# Patient Record
Sex: Male | Born: 1975 | Race: White | Hispanic: No | Marital: Married
Health system: Southern US, Community
[De-identification: ages and names within clinical notes are randomized; demographics above are authoritative.]

---

## 1999-08-13 ENCOUNTER — Encounter: Admission: RE | Admit: 1999-08-13 | Discharge: 1999-08-13 | Payer: Self-pay | Admitting: Gastroenterology

## 1999-08-13 ENCOUNTER — Encounter: Payer: Self-pay | Admitting: Gastroenterology

## 2003-07-22 ENCOUNTER — Inpatient Hospital Stay (HOSPITAL_COMMUNITY): Admission: EM | Admit: 2003-07-22 | Discharge: 2003-08-01 | Payer: Self-pay | Admitting: Emergency Medicine

## 2003-07-22 ENCOUNTER — Encounter: Admission: RE | Admit: 2003-07-22 | Discharge: 2003-07-22 | Payer: Self-pay | Admitting: Internal Medicine

## 2003-07-26 ENCOUNTER — Encounter (INDEPENDENT_AMBULATORY_CARE_PROVIDER_SITE_OTHER): Payer: Self-pay | Admitting: *Deleted

## 2003-07-26 ENCOUNTER — Encounter (INDEPENDENT_AMBULATORY_CARE_PROVIDER_SITE_OTHER): Payer: Self-pay | Admitting: Specialist

## 2003-08-07 ENCOUNTER — Encounter: Admission: RE | Admit: 2003-08-07 | Discharge: 2003-08-07 | Payer: Self-pay | Admitting: Thoracic Surgery

## 2003-08-28 ENCOUNTER — Encounter: Admission: RE | Admit: 2003-08-28 | Discharge: 2003-08-28 | Payer: Self-pay | Admitting: Thoracic Surgery

## 2003-10-30 ENCOUNTER — Encounter: Admission: RE | Admit: 2003-10-30 | Discharge: 2003-10-30 | Payer: Self-pay | Admitting: Thoracic Surgery

## 2005-07-27 ENCOUNTER — Encounter: Admission: RE | Admit: 2005-07-27 | Discharge: 2005-07-27 | Payer: Self-pay | Admitting: Family Medicine

## 2005-08-23 IMAGING — CR DG CHEST 2V
2 series · 2 of 2 positions shown · non-contrast
Comparison: none

CLINICAL DATA: Shortness of breath.
 CHEST, TWO VIEWS
 PA and lateral views reveal the heart size to be normal.  There is a large right pleural effusion with compression atelectasis and pneumonia of the right base.  There is also a small left effusion.  
 IMPRESSION
 Large right effusion with atelectasis and pneumonia in the right base.  There is also noted to be ileus of the colon.

[view not recorded (1 of 2)]
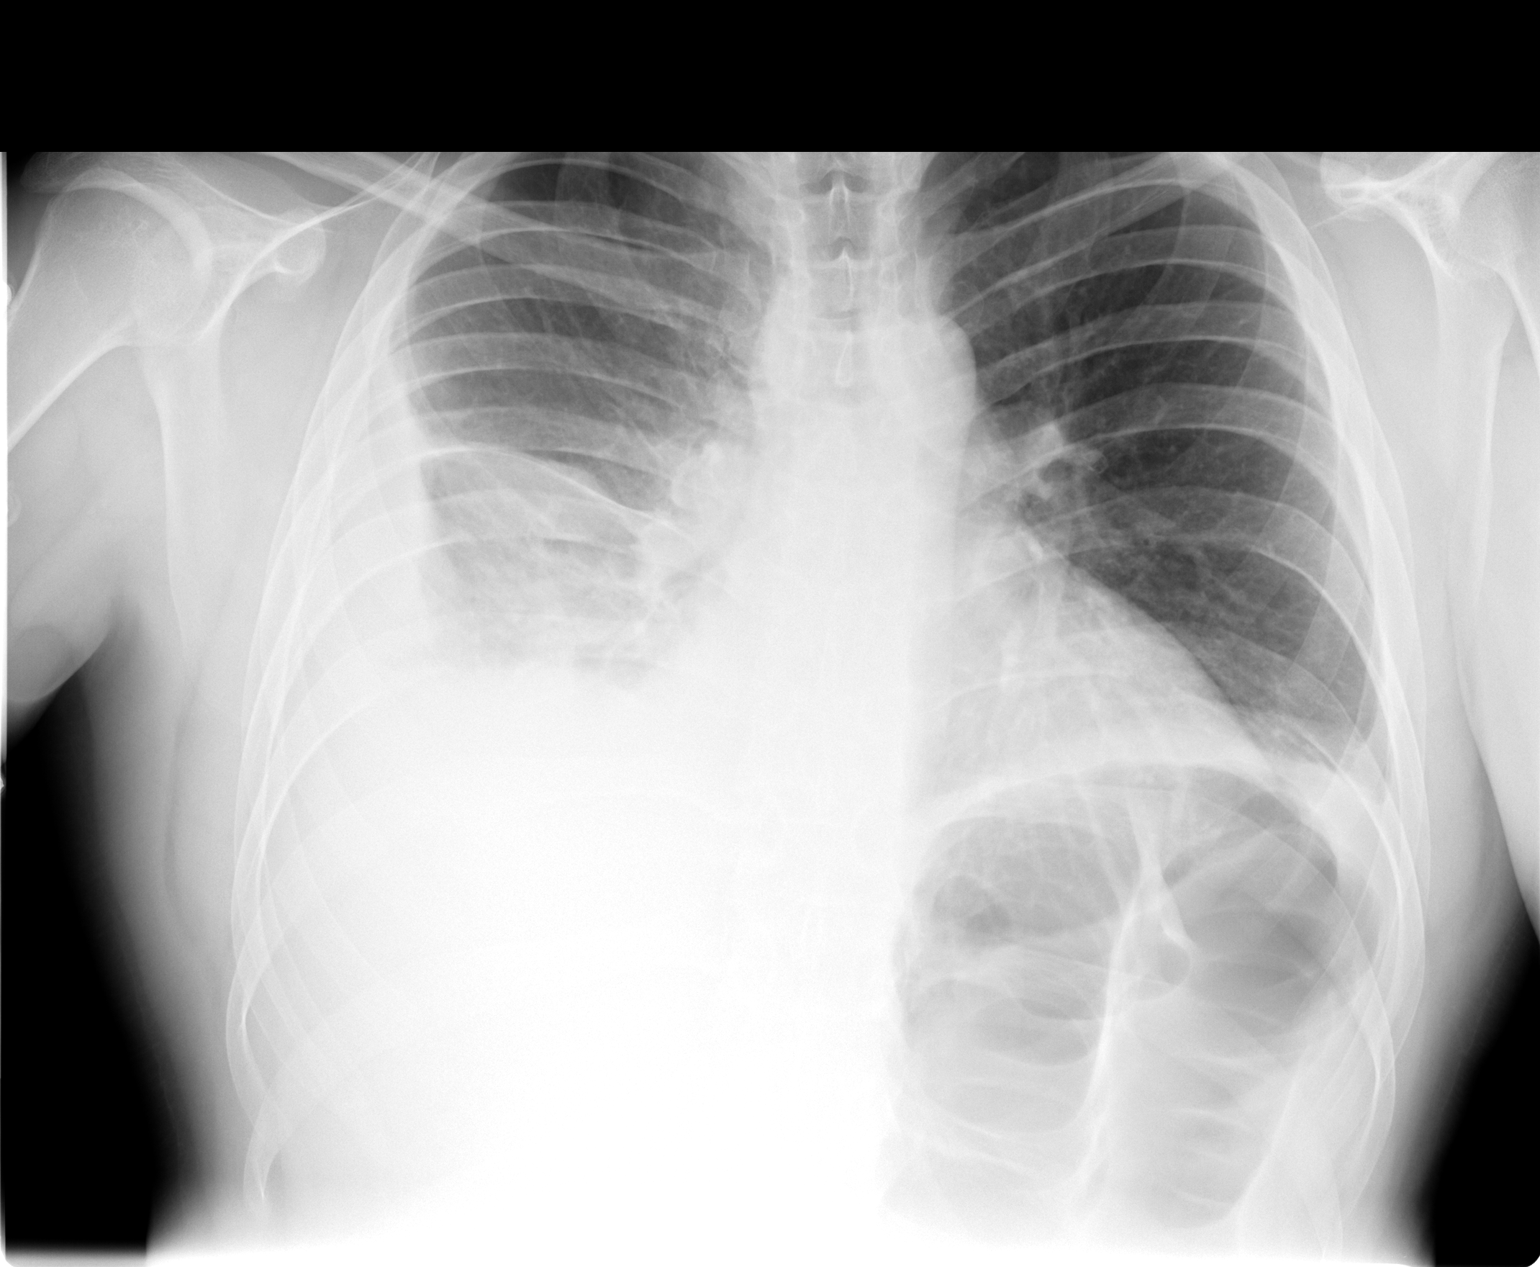

[view not recorded (2 of 2)]
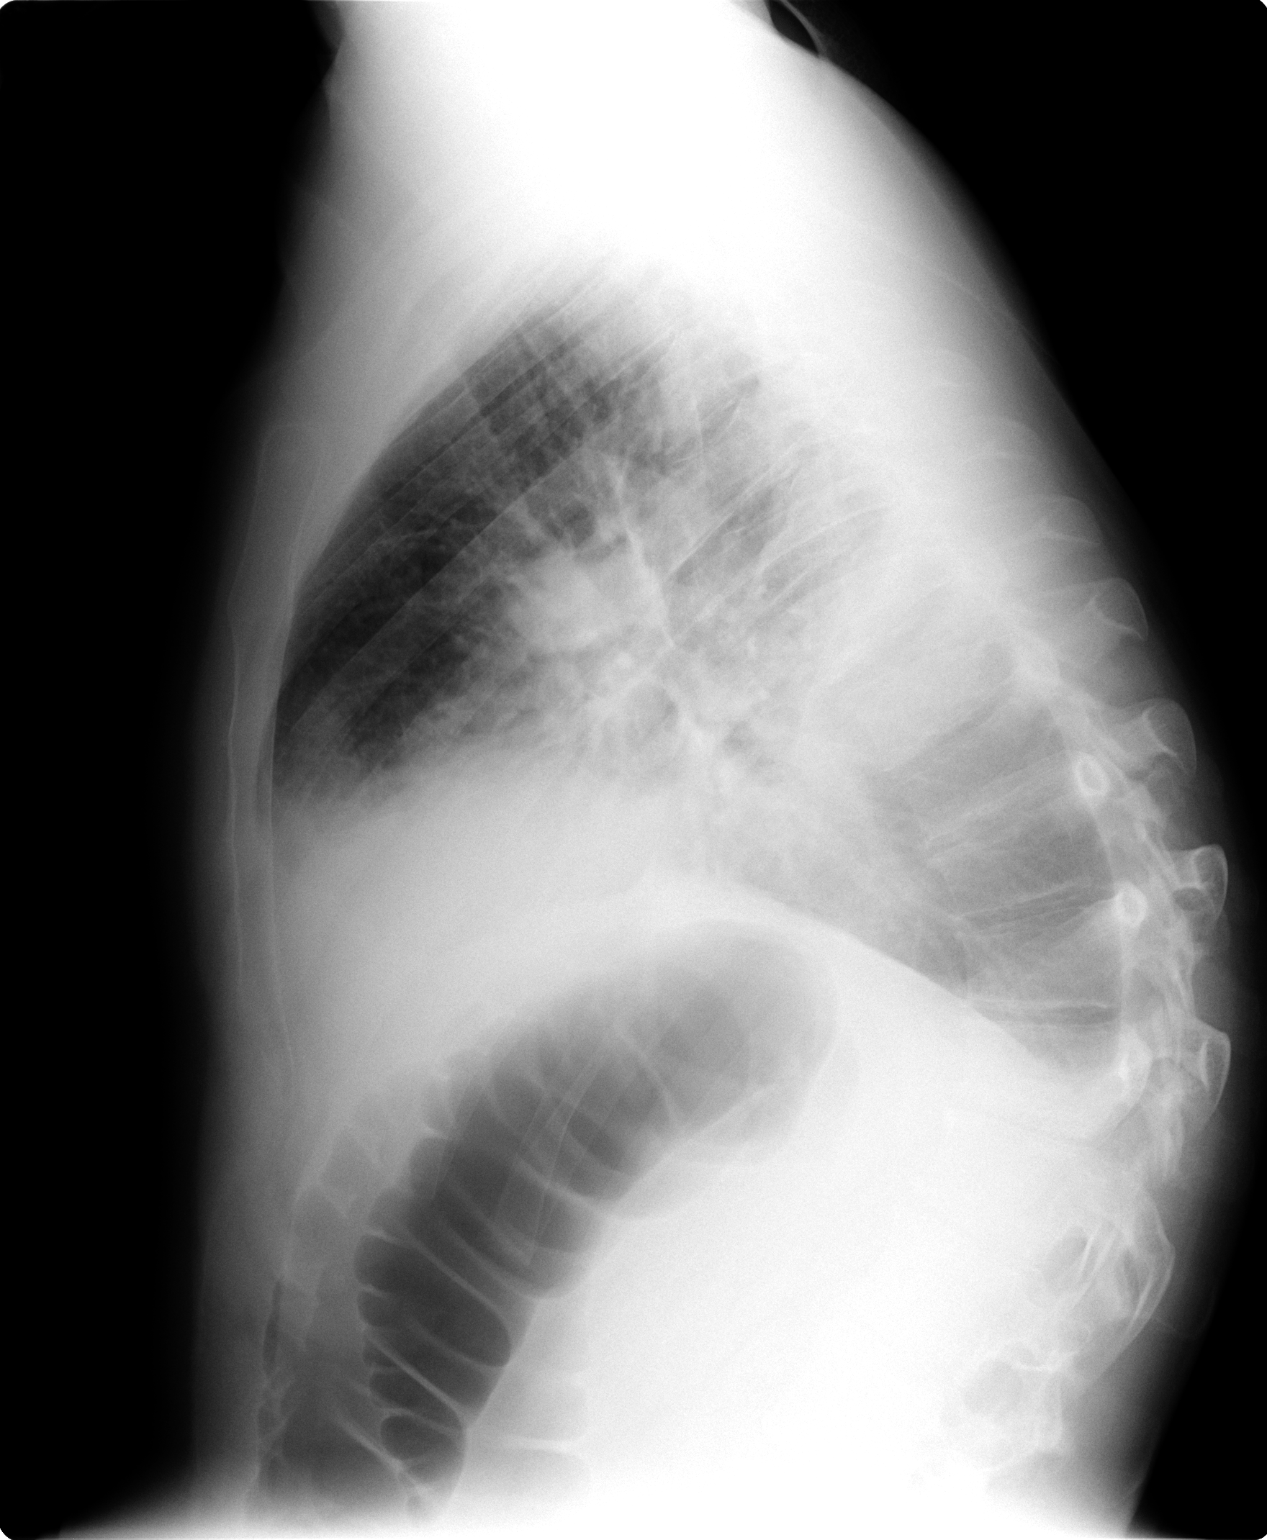

[2 of 2 positions shown; findings below may reference images not displayed]

## 2005-08-24 IMAGING — CR DG CHEST 1V
1 series · 1 of 1 positions shown · non-contrast
Comparison: none

CLINICAL DATA: Status post thoracentesis. 
 CHEST (ONE VIEW)
 Exam 3044 hours. Compared to 07/22/2003. 
 Slight decreased right pleural effusion.  No pneumothorax.  Cardiomegaly with low lung volumes, bibasilar atelectasis and moderate right pleural effusion persists. 
 IMPRESSION
 No pneumothorax following right thoracentesis.  A moderate amount of residual pleural fluid is identified in the right hemithorax.

[view not recorded]
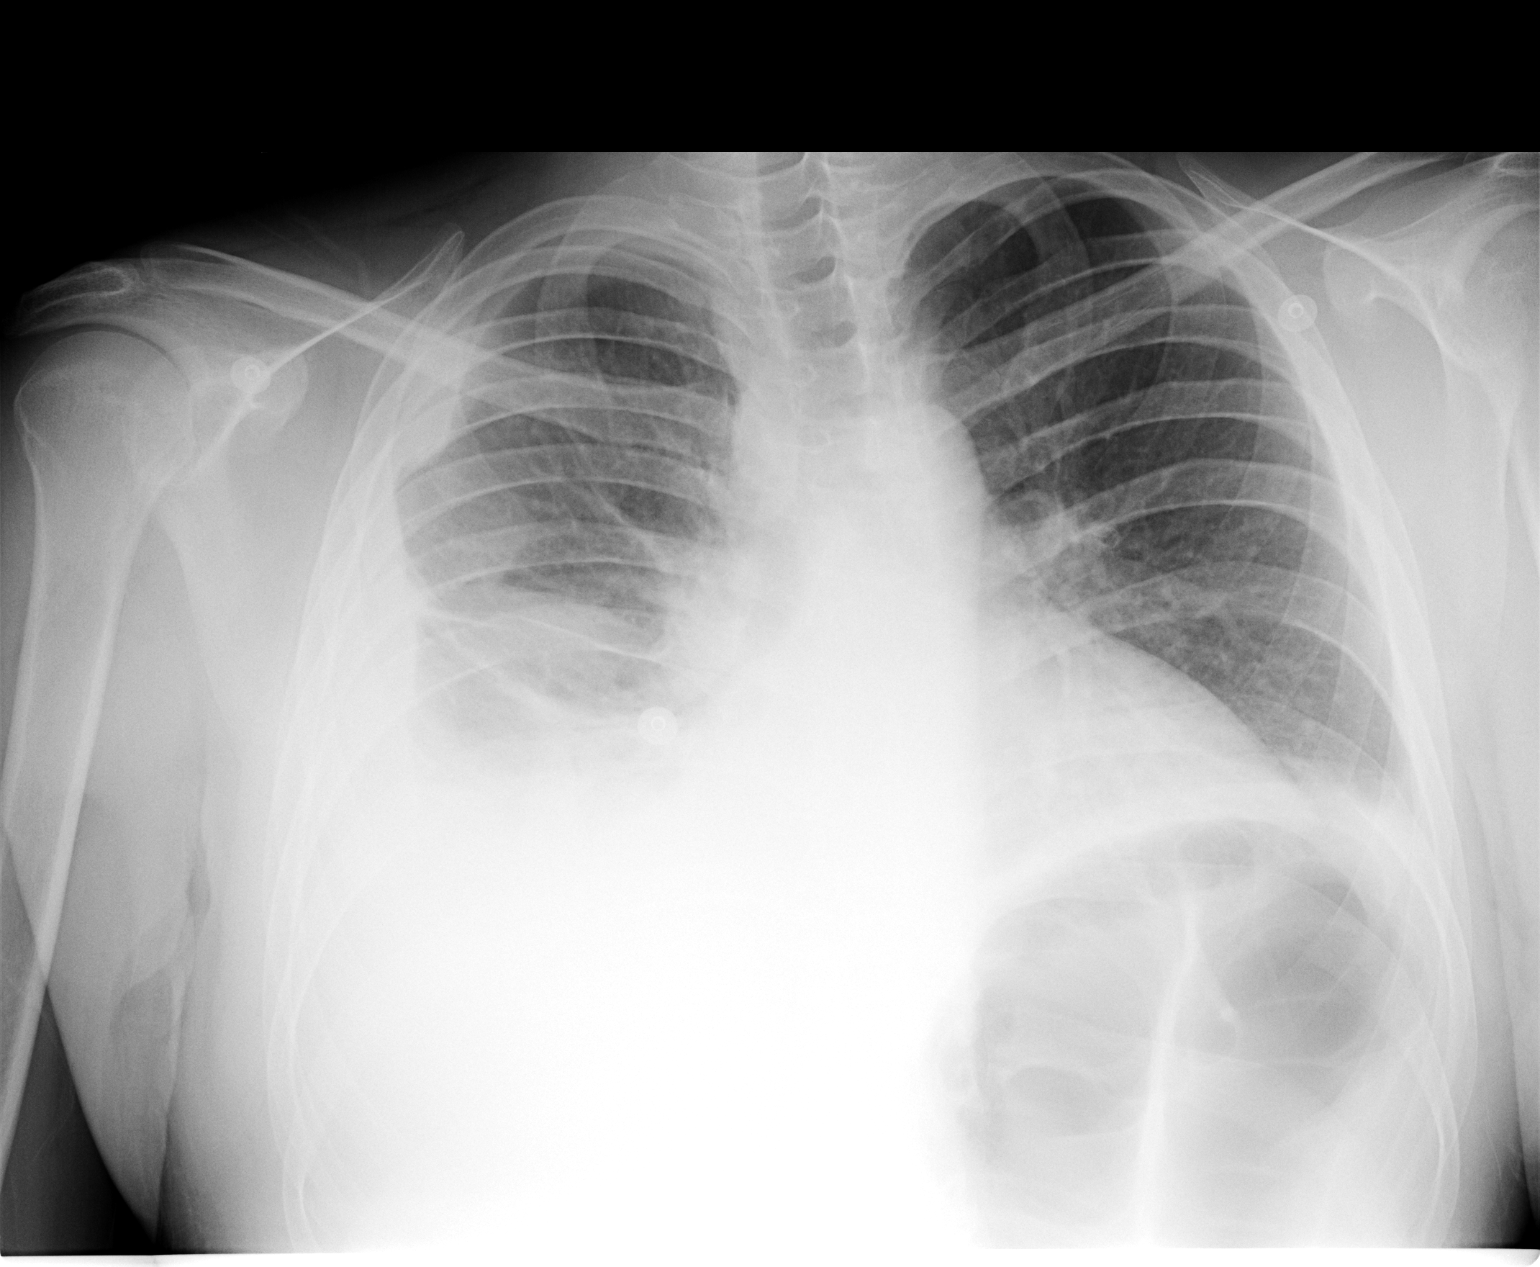

[1 of 1 positions shown; findings below may reference images not displayed]

## 2005-09-08 IMAGING — CR DG CHEST 2V
2 series · 2 of 2 positions shown · non-contrast
Comparison: 08/01/03.

CLINICAL DATA: Status-post right VATS for empyema drainage. 
 TWO VIEW CHEST

[view not recorded (1 of 2)]
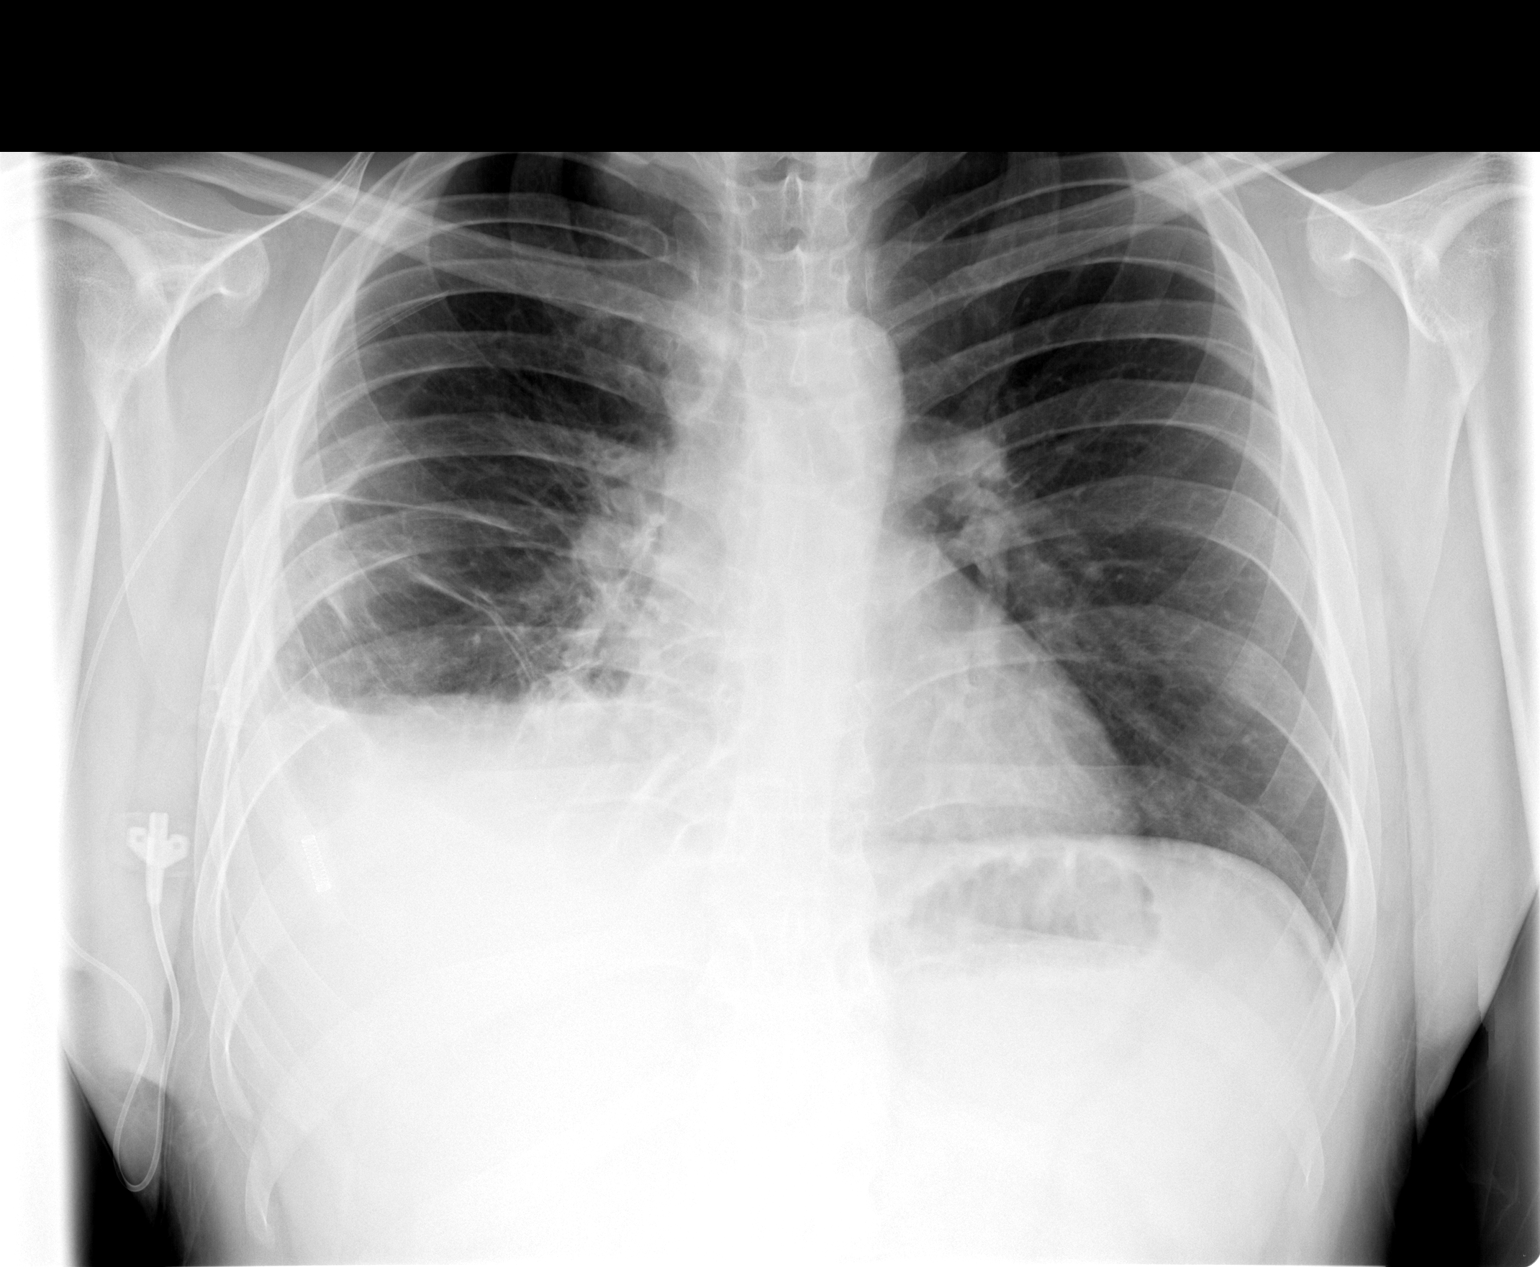

[view not recorded (2 of 2)]
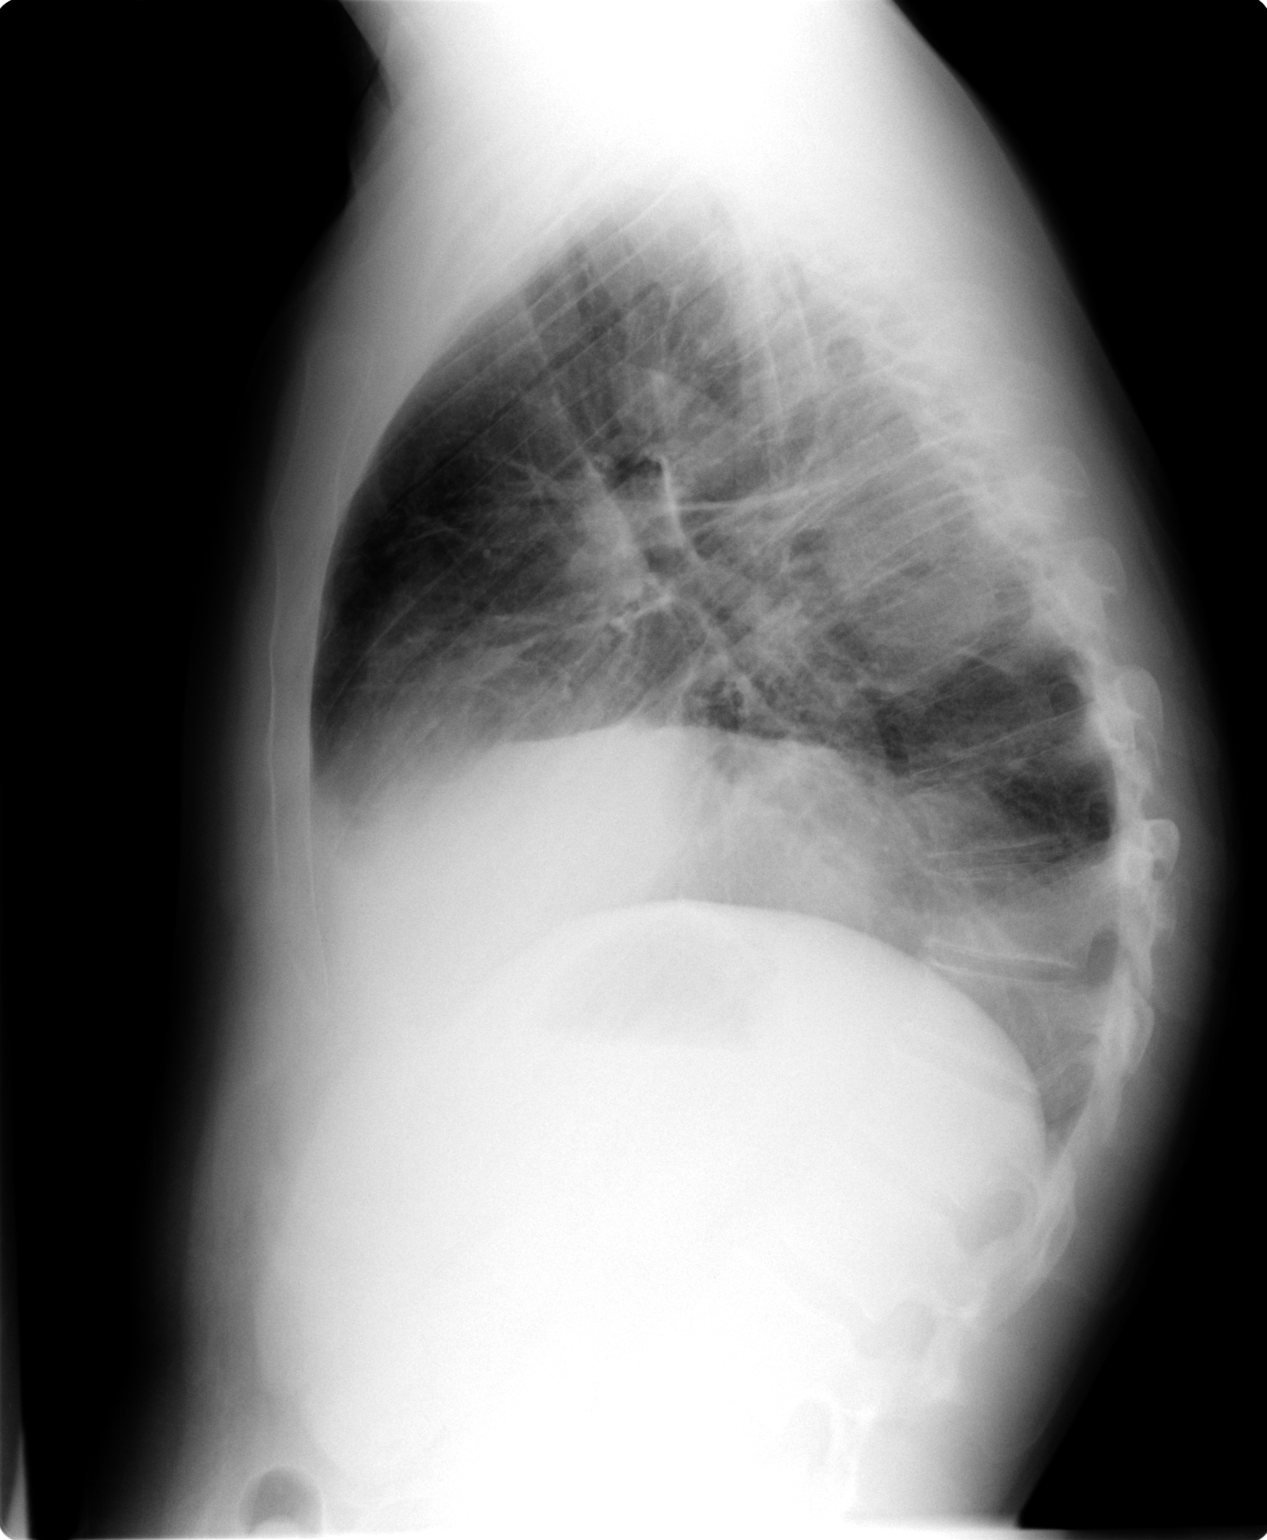

[2 of 2 positions shown; findings below may reference images not displayed]

FINDINGS: Two view exam of the chest shows persistent asymmetric elevation of the right hemidiaphragm.  There is right base chronic atelectasis or scar and right pleural fluid versus thickening.  Pleural opacity in the medial right apex is stable.  The right IJ central line seen on the previous exam has been removed and the right-sided PICC line has been pulled back with the tip now projecting at the level of the subclavian vein. 
 IMPRESSION
 No substantial interval change in the right base chronic atelectasis or scar with persistent small right pleural effusion versus pleural thickening. 
 Asymmetric elevation of the right hemidiaphragm.

## 2006-04-25 ENCOUNTER — Ambulatory Visit: Payer: Self-pay | Admitting: Internal Medicine

## 2006-04-27 ENCOUNTER — Ambulatory Visit: Payer: Self-pay | Admitting: Internal Medicine

## 2006-11-01 ENCOUNTER — Ambulatory Visit: Payer: Self-pay | Admitting: Family Medicine

## 2007-05-18 ENCOUNTER — Ambulatory Visit: Payer: Self-pay | Admitting: Family Medicine

## 2010-09-11 NOTE — Op Note (Signed)
NAME:  [name], [name]                          ACCOUNT NO.:  [id number]   MEDICAL RECORD NO.:  [id number]                   PATIENT TYPE:  INP   LOCATION:  3308                                 FACILITY:  [facility]   PHYSICIAN:  Ines Bloomer, M.D.              DATE OF BIRTH:  [birth year]   DATE OF PROCEDURE:  07/26/2003  DATE OF DISCHARGE:                                 OPERATIVE REPORT   PREOPERATIVE DIAGNOSIS:  Right lower lobe pneumonia with peripneumonic  infusion, probable empyema.   POSTOPERATIVE DIAGNOSIS:  Right chest empyema.   OPERATION PERFORMED:  Right VATS, right thoracotomy with drainage of empyema  and decortication.   SURGEON:  Ines Bloomer, M.D.   FIRST ASSISTANT:  Carolyn A. Thelma Barge, P.A.-C.   ANESTHESIA:  General.   After percutaneous insertion of all monitoring lines, the patient was turned  to the right lateral thoracotomy position.  A dual lumen tube was inserted.  The patient was prepped and draped in the usual sterile manner.  Two trocar  sites were made in the anterior and  posterior axillary line at the seventh  to eighth intercostal space.  Two trocars were inserted and the 30 degree  scope was inserted.  The patient obviously had empyema with a marked amount  of exudate and fluid.  The right lower lobe, right middle lobe, and part of  the right upper lobe were all covered.  Because an extensive partial  debridement was done using Kaiser ring forceps removing the fluid and the  exudate from the lateral wall, however, there was so much exudate we decided  to do a mini-thoracotomy.  An incision was made over the sixth intercostal  space, the latissimus was partially divided, and the serratus was reflected  anteriorly.  The sixth intercostal space was entered.  Two Tuffier  retractors were placed at right angles.  The lung was first freed up freeing  the right middle lobe and right lower lobe off the diaphragm and then  freeing the right middle lobe  and right upper lobe anterior segment off the  mediastinum and the right upper lobe off the apex and then laterally freeing  the right upper lobe and right middle lobe off the lateral chest wall.  After the lung had been freed up, the parietal pleura was debrided of all  exudate and the diaphragm was debrided of all exudate, particularly in the  right costophrenic angle.  After all debris had been removed from the  parietal pleura, attention was turned to the right lower lobe.  This was  grasped with a Duval lung clamp and then using ring forceps as well as  Russian forceps, the exudate was debrided off the right middle lobe.  There  were two areas in the fissure, both the major fissure and the minor fissure,  which had marked collection of pus and this was debrided out and debrided  off the right middle lobe and right lower lobe and then off the right middle  lobe and the right upper lobe.  Attention was turned to the right middle  lobe after the right lower lobe had been decorticated and in a like manner,  this was done as well as in the right upper lobe.  After all debris had been  removed from the visceral pleura, two chest tubes were inserted through the  trocar sites, number straight 36 and tied in place with 0 silk.  The area  was copiously irrigated.  A third chest tube was placed between the other  two chest tubes which was a right angle and sutured in place with 0 silk.  A  Marcaine block was done in the usual fashion and the chest was closed with  three pericostals.  The catheters were placed in the interspace in the usual  fashion.  The muscle was closed with #1 Vicryl, the subcutaneous tissue with  2-0 Vicryl, and Dermabond for the skin.  The patient was returned to the  recovery room in serious condition.                                               Ines Bloomer, M.D.    DPB/MEDQ  D:  07/26/2003  T:  07/26/2003  Job:  [id number]

## 2010-09-11 NOTE — H&P (Signed)
NAME:  [name], [name]                          ACCOUNT NO.:  [id number]   MEDICAL RECORD NO.:  [id number]                   PATIENT TYPE:  EMS   LOCATION:  MAJO                                 FACILITY:  [facility]   PHYSICIAN:  Deirdre Peer. Polite, M.D.              DATE OF BIRTH:  July 21, 1975   DATE OF ADMISSION:  07/22/2003  DATE OF DISCHARGE:                                HISTORY & PHYSICAL   PRIMARY CARE PHYSICIAN:  Sharlet Salina, M.D. of the [facility].   CHIEF COMPLAINT:  Shortness of breath, chest pain.   HISTORY OF PRESENT ILLNESS:  Mr. Hjort is a 35 year old male with past  medical history of Crohn's diagnosed in 1995 which has been quiescent since  then who presents to the ED after being evaluated in his primary M.D.'s  office for the other above chief complaint.  The patient dates his symptoms  back to 2 weeks ago when he had fever, chills, and myalgias.  These symptoms  lasted for approximately 3 to 4 days.  The patient was exposed to a sick  contact, his daughter; however, he stated that those symptoms got better.  Approximately one week ago, the patient complained of right-sided pain and  at that time was seen at an urgent care, last Thursday.  At that time,he was  diagnosed with pleurisy and treated with steroids and pain medication.  The  patient's symptoms progressively got worse; therefore, presented to his  primary M.D. for evaluation.  At that time, the patient had a chest x-ray  which showed significant pleural effusion.  The patient was sent to the ED  for further evaluation.  In the ED, the patient had CT of the chest which  showed significant right-sided effusion, questionable small loculated areas.  Screening labs show significant leukocytosis.  [facility] were  called for further evaluation because of the above complaint.  As stated  above, the patient stated the symptoms started about 2 weeks ago with fever,  chills, and myalgias and  nonproductive cough that subsequently proceeded to  a right-sided pain and shortness of breath.  In addition, the patient  experienced some weight loss secondary to decreased appetite.  He denied any  night sweats.  As stated his cough was nonproductive.  He also denies any  adenopathy.  The patient denies ever having any similar symptoms before.  Admission was deemed necessary for further evaluation and treatment of right-  sided effusion.   PAST MEDICAL HISTORY:  Significant for Crohn's disease diagnosed in 1996.  The patient is without any medications greater than 4 years for the Crohn's  disease.  He denies any other medical problems.   MEDICATIONS:  None.   SOCIAL HISTORY:  Negative for tobacco.  Negative for alcohol.  No drugs.   PAST SURGICAL HISTORY:  Significant for a history of perforated viscus in  1996, questionable partial colectomy.  At  that time colostomy x3 months.  He  also had an appendectomy at that time.  The patient denies cholecystectomy.   ALLERGIES:  No known drug allergies.Marland Kitchen   FAMILY HISTORY:  Noncontributory.   PHYSICAL EXAMINATION:  VITAL SIGNS:  Temperature 98, BP 117/87, pulse 105,  respiratory rate 20.  Saturation 95% on 2 liters.  GENERAL:  The patient is ill in appearance with obvious shortness of breath.  HEENT:  Significant for icteric sclerae.  No oral lesions.  No nodes.  CHEST:  Decreased breath sounds one-half up on the right, decreased in the  base on the left, positive dullness to percussion.  CARDIOVASCULAR:  Tachycardia.  ABDOMEN:  Soft, right upper quadrant tenderness with palpation.  No  hepatomegaly appreciated.  EXTREMITIES:  No edema.  NEUROLOGICAL:  Nonfocal.   DATA:  White count 26.5, hemoglobin 15.3, platelets 392.  Sodium 134,  potassium 3.4, chloride 92, carbon dioxide 28, BUN 9, creatinine 0.8, AST  and ALT 30 and 49 respectively, bilirubin 6.2.   ASSESSMENT AND PLAN:  1. Right-sided pleural effusion rule out infections  etiology, i.e.,     parapneumonic effusion versus other cause.  2. Dyspnea on exertion secondary to #1.  3. Significant leukocytosis.   Recommend the patient be admitted to step-down unit for evaluation and  treatment of significant right pleural effusion causing respiratory  difficulty.  The patient will be started with broad-spectrum antibiotics, IV  Zosyn, will be given frequent nebs, O2, and will arrange plans for an urgent  thoracentesis.  As there is very minimal loculation, we will start with the  thoracentesis and the patient may require a CT surgical consult.  Will  obtain cultures of the fluid to rule any pathology as well as panculture.  Will make further recommendations after review of the above studies.                                                Deirdre Peer. Polite, M.D.    RDP/MEDQ  D:  07/22/2003  T:  07/22/2003  Job:  [id number]   cc:   Sharlet Salina, M.D.  [address]  [place]  [place] [address]  Fax: [phone]

## 2010-09-11 NOTE — Discharge Summary (Signed)
NAME:  [name], [name]                          ACCOUNT NO.:  [id number]   MEDICAL RECORD NO.:  [id number]                   PATIENT TYPE:  INP   LOCATION:  5704                                 FACILITY:  [facility]   PHYSICIAN:  Ines Bloomer, M.D.              DATE OF BIRTH:  1975/09/27   DATE OF ADMISSION:  07/22/2003  DATE OF DISCHARGE:  07/31/2003                                 DISCHARGE SUMMARY   ADMISSION DIAGNOSIS:  Shortness of breath and chest pain.   PAST MEDICAL HISTORY:  Crohn's disease diagnosed in 15.   ALLERGIES:  No known drug allergies.   DISCHARGE DIAGNOSES:  Group A Streptococcal pneumonia, empyema, and  bacteremia, status post right video-assisted thoracic surgery, right  thoracotomy with drainage of empyema and decortication.   HISTORY OF PRESENT ILLNESS:  The patient is a 35 year old male with a past  medical history significant for Crohn's disease diagnosed in 1995 from which  he has had no problems since that time.  The patient presented to the  emergency department on July 22, 2003, after being evaluated by his primary  care doctor for shortness of breath and chest pain.  The patient stated his  symptoms began approximately two weeks prior to admission when he also had  fever, chills, and myalgias.  His symptoms lasted approximately three to  four days.  The patient was exposed to a sick contact, his daughter,  however, he stated that his symptoms improved.  Approximately one week prior  to admission, the patient complained of right-sided pain and was seen at  that time at Urgent Care.  The patient was diagnosed with pleurisy and  treated with steroids and pain medication.  The patient's symptoms  progressively worsened and therefore he presented to his primary care  physician for evaluation.  At that time, the patient had a chest x-ray which  showed a significant pleural effusion.  The patient was sent to the  emergency department for further  evaluation.  A CT of the chest was obtained  in the ED which showed significant right-sided effusion, with a questionable  small loculated areas.  Initial screening labs showed significant  leukocytosis.  [facility] were consulted at that time for further  evaluation.  After evaluation by Deirdre Peer. Polite, M.D., the patient was  subsequently admitted for further evaluation and treatment of right-sided  effusion.   HOSPITAL COURSE:  The patient presented to the emergency department on July 22, 2003, complaining of shortness of breath and chest pain.  The patient  was evaluated by the emergency department physician as well as a  Hospitalist, Dr. Nehemiah Settle.  After discovering a significant right-sided  effusion with questionable small loculated areas, the patient was admitted  for further evaluation and treatment of her right-sided effusion.  The  patient was started on IV Zosyn, given nebulizers, O2, and an urgent  thoracentesis was arranged.  Initial  labs showed hypokalemia with a  potassium of 2.6 which was subsequently treated.  Labs also revealed a  hyperbilirubinemia with a level of 6.2.  The Hospitalist subsequently  required a GI consult as well as a CT consult.  Dr. Edwyna Shell evaluated the  patient on July 22, 2003, and agreed with thoracentesis with fluid  cultures.  It was his opinion that the patient would likely need a video-  assisted thoracic surgery decortication.  He was also concerned about the  hyperbilirubinemia and suggested a GI consult prior to general anesthesia.  Interventional radiology performed an ultrasound guided right thoracentesis  on July 23, 2003, which yielded 720 mL of thick, yellow, purulent fluid.  This was sent for labs and cultures.  Chest x-ray status post thoracentesis  revealed no change in the effusion.  The patient had a loculated empyema and  would require a decortication.  Repeat labs revealed a bilirubin of 4.3  which had decreased.   Pleural fluid and blood cultures revealed gram  positive cocci.  GI consultation was obtained on July 24, 2003, regarding  the patient's hyperbilirubinemia.  An abdominal ultrasound revealed  gallbladder sludge and liver hemangioma.  Hepatitis A, B, and C were all  negative.  It was the GI doctor's opinion that hyperbilirubinemia was  probably secondary to the bacteremia.  It was his recommendation that an MR  cholangiogram be performed.  The patient was maintained on IV antibiotics.  Dr. Ewing Schlein reevaluated the patient on March 31, prior to surgery.  It was his  opinion that the patient did not have chronic liver disease.  His elevated  liver enzymes on admission were normalizing at that time.  It was his  opinion that the patient should proceed with surgery as planned.   The patient was taken to the OR on July 26, 2003, for a right video-assisted  thoracic surgery, mini thoracotomy, drainage of empyema, and decortication.  The patient tolerated the procedure well and was hemodynamically stable  immediately postoperatively.  Specimens and findings that were obtained  during the surgery included pleural fluid and empyema which were sent for  culture and gram stain.  The chest cavity was really infected with multiple  pleural abscesses.  The patient was transferred from the OR to the  postanesthesia care unit in stable condition.  The patient was extubated  without complications and awoken from anesthesia neurologically intact.  The  patient was subsequently transferred to unit 3300 without complications.  Pleural fluid cultures were positive for group A Streptococcus pyogenes and  therefore infectious disease was consulted regarding longterm antibiotic  recommendations.  The patient has progressed as expected postoperatively and  has remained in stable condition.  The patient's anterior chest tube was  discontinued on postoperative day #3 without complications.  Dr. Roxan Hockey of infectious  disease evaluated the patient on July 29, 2003.  It was his  recommendation that the patient be started on IV Rocephin.  The patient's  posterior chest tube was discontinued on postoperative day #4 without  complications.  On postoperative day #5, the patient was in stable condition  and was feeling well.  The last chest tube was discontinued without  complications and the patient was transferred to unit 5700 without problems.  The patient's white count had decreased to 13,000.  The patient was felt to  be in stable condition for discharge within the next one to two days on home  IV antibiotics.   LABORATORY DATA:  CBC on July 31, 2003;  white count 13.5, hemoglobin 9.9,  hematocrit 28.5, platelets 639.  Chem-7 on July 29, 2003; sodium 133,  potassium 3.9, chloride 99, CO2 28, BUN 3, creatinine 0.8, glucose 114.  AST  34, ALT 30, alkaline phosphatase 129, total bilirubin 1.8.   CONDITION ON DISCHARGE:  Improved.   DISCHARGE MEDICATIONS:  1. Rocephin 2 grams IV q.24h x2 weeks from the date of surgery, last dose to     be administered on August 09, 2003.  2. Tylox one to two p.o. q.4-6h p.r.n. pain.   ACTIVITY:  No driving, no strenuous activity, and the patient is to continue  daily breathing and walking exercises.   DIET:  No restrictions.   WOUND CARE:  The patient is to shower daily and clean the incisions with  soap and water.   DISCHARGE INSTRUCTIONS:  If the patient notices that his incisions are red,  swollen, or draining or if he has a fever of 101 degrees Fahrenheit, he is  to call the [facility].  A home health nurse will be established to  administer outpatient IV antibiotics.   FOLLOW UP:  Appointment with Dr. Edwyna Shell on August 07, 2003, at 4:30 p.m.  The  patient must go to [facility] one hour prior to this  appointment to have a chest x-ray taken which he will bring with him to the  appointment with Dr. Edwyna Shell.      Pecola Leisure, [place]                       Ines Bloomer, M.D.    AY/MEDQ  D:  07/31/2003  T:  08/02/2003  Job:  [id number]

## 2014-01-11 DIAGNOSIS — M79609 Pain in unspecified limb: Secondary | ICD-10-CM

## 2015-10-16 DIAGNOSIS — R52 Pain, unspecified: Secondary | ICD-10-CM

## 2016-05-28 ENCOUNTER — Ambulatory Visit (INDEPENDENT_AMBULATORY_CARE_PROVIDER_SITE_OTHER): Payer: 59 | Admitting: Podiatry

## 2016-05-28 ENCOUNTER — Ambulatory Visit (INDEPENDENT_AMBULATORY_CARE_PROVIDER_SITE_OTHER): Payer: 59

## 2016-05-28 ENCOUNTER — Encounter: Payer: Self-pay | Admitting: Podiatry

## 2016-05-28 VITALS — HR 69 | Resp 16 | Ht 70.0 in | Wt 168.0 lb

## 2016-05-28 DIAGNOSIS — M722 Plantar fascial fibromatosis: Secondary | ICD-10-CM

## 2016-05-28 MED ORDER — DICLOFENAC SODIUM 75 MG PO TBEC: 75.0000 mg | DELAYED_RELEASE_TABLET | Freq: Two times a day (BID) | ORAL | 2 refills | Status: DC

## 2016-05-28 MED ORDER — TRIAMCINOLONE ACETONIDE 10 MG/ML IJ SUSP
10.0000 mg | Freq: Once | INTRAMUSCULAR | Status: AC
  Administered 2016-05-28: 10 mg

## 2016-05-28 NOTE — Progress Notes (Signed)
   Subjective:    Patient ID: [name], male    DOB: 03/22/1976, 41 y.o.   MRN: [id number]  HPI  Chief Complaint  Patient presents with  . Foot Pain    Right; Bottom of heel pain x 8 months. 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        Review of Systems     Objective:   Physical Exam        Assessment & Plan:

## 2016-05-28 NOTE — Patient Instructions (Signed)

## 2016-05-28 NOTE — Progress Notes (Signed)
Subjective:     Patient ID: [name], male   DOB: 04/26/1975, 41 y.o.   MRN: [id number]  HPI patient presents with exquisite discomfort in the right heel at the insertional point of the tendon into the calcaneus with inflammation fluid and states that he is a runner and put a lot of pressure on his feet. States it's been present for about 8 months   Review of Systems  All other systems reviewed and are negative.      Objective:   Physical Exam  Constitutional: He is oriented to person, place, and time.  Cardiovascular: Intact distal pulses.   Musculoskeletal: Normal range of motion.  Neurological: He is oriented to person, place, and time.  Skin: Skin is warm.  Nursing note and vitals reviewed.  neurovascular status found to be intact muscle strength was adequate range of motion within normal limits with no equinus condition noted with moderate depression of the arch and exquisite discomfort plantar fascia right at the insertional point of the tendon into the calcaneus     Assessment:     Acute plantar fasciitis with chronic element to condition    Plan:     H&P x-rays reviewed and today I injected the right plantar fascia 3 mg Kenalog 5 mg Xylocaine dispensed fascial brace with instructions on usage gave instructions on physical therapy and placed on diclofenac 75 mg twice a day. Discussed long-term orthotics and reappoint in 2 weeks to reevaluate  X-ray indicates spur formation with no indications of stress fracture arthritis

## 2016-06-11 ENCOUNTER — Ambulatory Visit (INDEPENDENT_AMBULATORY_CARE_PROVIDER_SITE_OTHER): Payer: 59 | Admitting: Podiatry

## 2016-06-11 ENCOUNTER — Encounter: Payer: Self-pay | Admitting: Podiatry

## 2016-06-11 DIAGNOSIS — M722 Plantar fascial fibromatosis: Secondary | ICD-10-CM

## 2016-06-12 NOTE — Progress Notes (Signed)
Subjective:     Patient ID: [name], male   DOB: 11/09/1975, 41 y.o.   MRN: [id number]  HPI patient presents stating that his pain has improved but is still present right with discomfort and he wants to be very active and right   Review of Systems     Objective:   Physical Exam Neurovascular status intact with patient found to have inflammation and pain in the right plantar heel of a mild nature with significant improvement    Assessment:     Patient has biomechanical deformity with inflammation pain right heel that's improved but present    Plan:     Spent a great of time going over physical therapy supportive shoe gear usage and ice therapy and scanned for custom orthotics to reduce plantar pressure on the heel

## 2016-07-06 ENCOUNTER — Ambulatory Visit: Payer: 59 | Admitting: *Deleted

## 2016-07-06 DIAGNOSIS — M722 Plantar fascial fibromatosis: Secondary | ICD-10-CM

## 2016-07-06 NOTE — Patient Instructions (Signed)

## 2016-07-06 NOTE — Progress Notes (Signed)
Patient ID: [name], male   DOB: 11/11/1975, 41 y.o.   MRN: [id number]   Patient presents for orthotic pick up.  Verbal and written break in and wear instructions given.  Patient will follow up in 4 weeks if symptoms worsen or fail to improve.

## 2017-12-21 ENCOUNTER — Ambulatory Visit: Payer: 59 | Admitting: Podiatry

## 2017-12-21 ENCOUNTER — Other Ambulatory Visit: Payer: Self-pay | Admitting: Podiatry

## 2017-12-21 ENCOUNTER — Encounter: Payer: Self-pay | Admitting: Podiatry

## 2017-12-21 ENCOUNTER — Ambulatory Visit (INDEPENDENT_AMBULATORY_CARE_PROVIDER_SITE_OTHER): Payer: 59

## 2017-12-21 DIAGNOSIS — M79672 Pain in left foot: Secondary | ICD-10-CM

## 2017-12-21 DIAGNOSIS — M7662 Achilles tendinitis, left leg: Secondary | ICD-10-CM

## 2017-12-21 MED ORDER — DICLOFENAC SODIUM 75 MG PO TBEC: 75.0000 mg | DELAYED_RELEASE_TABLET | Freq: Two times a day (BID) | ORAL | 2 refills | Status: AC

## 2017-12-21 MED ORDER — TRIAMCINOLONE ACETONIDE 10 MG/ML IJ SUSP
10.0000 mg | Freq: Once | INTRAMUSCULAR | Status: AC
Start: 2017-12-21 — End: 2017-12-21
  Administered 2017-12-21: 10 mg

## 2017-12-21 NOTE — Patient Instructions (Signed)

## 2017-12-21 NOTE — Progress Notes (Signed)
Subjective:   Patient ID: [name], male   DOB: 42 y.o.   MRN: [id number]   HPI Patient states on the back of the left heel he noticed a knot on the side about a week ago and is been very sore and he is tried ice and oral anti-inflammatories without relief.  He is very active and likes to run 35 miles a day and is due to run a approximate 80 mile race in the beginning of the year   ROS      Objective:  Physical Exam  Neurovascular status intact with inflammation pain of the Achilles tendon medial side left with a central lateral looking good with fluid buildup around this insertional point     Assessment:  Acute Achilles tendinitis medial side left insertional point     Plan:  H&P condition reviewed and I discussed injection and explained risk of injection with rupture.  Patient wants to take this risk and I understand and explained to him that he will need to be less active for the next week and he promises me to be this way and carefully I injected the medial side 3 mg Kenalog 5 Milgram Xylocaine not putting it directly into the tendon I instructed on ice physical therapy heel lift and if symptoms persist  X-rays indicate there is no spur formation or indication of bony pathology

## 2018-05-22 ENCOUNTER — Other Ambulatory Visit: Payer: Self-pay | Admitting: Podiatry

## 2018-05-22 ENCOUNTER — Ambulatory Visit (INDEPENDENT_AMBULATORY_CARE_PROVIDER_SITE_OTHER): Payer: No Typology Code available for payment source

## 2018-05-22 ENCOUNTER — Encounter: Payer: Self-pay | Admitting: Podiatry

## 2018-05-22 ENCOUNTER — Ambulatory Visit: Payer: 59 | Admitting: Podiatry

## 2018-05-22 DIAGNOSIS — M7661 Achilles tendinitis, right leg: Secondary | ICD-10-CM | POA: Diagnosis not present

## 2018-05-22 DIAGNOSIS — M79671 Pain in right foot: Secondary | ICD-10-CM | POA: Diagnosis not present

## 2018-05-22 NOTE — Progress Notes (Signed)
Subjective:   Patient ID: [name], male   DOB: 43 y.o.   MRN: [id number]   HPI Patient states I have developed pain in my right Achilles after doing a half and then full marathon over consecutive days.  Not been able to run since and I felt like I had a knot and its been painful   ROS      Objective:  Physical Exam  Neurovascular status intact negative for plantar fasciitis with inflammation at the muscle compartment junction right with inflammation fluid occurring at this point but no indications of tendon dysfunction or tear     Assessment:  Probability for acute strain of the Achilles tendon at the muscle tendon junction right that is continuing to get aggravated and painful with activity     Plan:  H&P x-ray reviewed and today I went ahead and advised this patient on heat ice therapy and applied air fracture walker for complete immobilization and to not allow activation of the tendon.  I then discussed anti-inflammatories and gradual increase in activities over the next 4 to 6 weeks and reevaluate to recheck  X-ray was negative for signs of posterior spurring or any increase in density with plantar heel spur noted

## 2018-09-06 ENCOUNTER — Other Ambulatory Visit: Payer: Self-pay

## 2018-09-06 ENCOUNTER — Other Ambulatory Visit: Payer: Self-pay | Admitting: Podiatry

## 2018-09-06 ENCOUNTER — Ambulatory Visit (INDEPENDENT_AMBULATORY_CARE_PROVIDER_SITE_OTHER): Payer: No Typology Code available for payment source

## 2018-09-06 ENCOUNTER — Ambulatory Visit: Payer: No Typology Code available for payment source | Admitting: Podiatry

## 2018-09-06 ENCOUNTER — Encounter: Payer: Self-pay | Admitting: Podiatry

## 2018-09-06 VITALS — Temp 97.7°F

## 2018-09-06 DIAGNOSIS — M779 Enthesopathy, unspecified: Secondary | ICD-10-CM

## 2018-09-06 DIAGNOSIS — M79672 Pain in left foot: Secondary | ICD-10-CM

## 2018-09-06 MED ORDER — TRIAMCINOLONE ACETONIDE 10 MG/ML IJ SUSP
10.0000 mg | Freq: Once | INTRAMUSCULAR | Status: AC
  Administered 2018-09-06: 10 mg

## 2018-09-06 MED ORDER — DICLOFENAC SODIUM 75 MG PO TBEC: 75.0000 mg | DELAYED_RELEASE_TABLET | Freq: Two times a day (BID) | ORAL | 2 refills | Status: AC

## 2018-09-07 NOTE — Progress Notes (Signed)
Subjective:   Patient ID: [name], male   DOB: 43 y.o.   MRN: [id number]   HPI Patient presents stating I am getting a lot of pain in my forefoot and states that he ran a marathon in January and after his Achilles started to hurt and now this is started to hurt him over the last month   ROS      Objective:  Physical Exam  Neurovascular status intact with patient found to have exquisite inflammation pain around the second MPJ left foot with no digital dysfunction and significant diminishment of discomfort in the right fascia     Assessment:  Inflammatory capsulitis acute in nature left second MPJ from probable gait change with history of Achilles tendinitis     Plan:  H&P reviewed both condition and get a focus on the left foot today did sterile prep of the left foot and injected 60 mg like Marcaine mixture for anesthesia and went ahead and aspirated the second MPJ getting out a small amount of clear fluid and injected quarter cc Dexasone Kenalog after explaining risk of capsular injection.  Applied padding to reduce pressure on the joint surface advised on rigid bottom shoes refill diclofenac and reappoint to recheck again in 2 weeks  X-ray indicates that there is no signs of stress fracture or digital displacement associated with MPJ inflammation

## 2022-02-02 ENCOUNTER — Ambulatory Visit
Admission: EM | Admit: 2022-02-02 | Discharge: 2022-02-02 | Disposition: A | Discharge status: Home / Self Care | Payer: BC Managed Care – PPO | Attending: Physician Assistant | Admitting: Physician Assistant

## 2022-02-02 DIAGNOSIS — R21 Rash and other nonspecific skin eruption: Secondary | ICD-10-CM | POA: Diagnosis not present

## 2022-02-02 MED ORDER — METHYLPREDNISOLONE SODIUM SUCC 125 MG IJ SOLR
80.0000 mg | Freq: Once | INTRAMUSCULAR | Status: AC
  Administered 2022-02-02: 80 mg via INTRAMUSCULAR

## 2022-02-02 NOTE — ED Triage Notes (Signed)
[facility] presents to [facility] with co of bilateral eye pain, swelling and rash on arms and a few spots one on face, legs. Pt reports rash is itchy as well as eyes. Pt has taken benadryl and eye drops for symptoms

## 2022-02-02 NOTE — ED Provider Notes (Signed)
[facility]    CSN: [id number] Arrival date & time: 02/02/22  7253      History   Chief Complaint Chief Complaint  Patient presents with   Rash   Eye Pain    HPI [name] is a 46 y.o. male.   Patient here today for evaluation of rash that is been present to his arms and legs with a few spots on his face that started a few days ago.  He reports that he has had some bilateral eye pain and swelling as well.  He has taken Benadryl and eyedrops with some improvement of symptoms.  He reports his wife made him come in to be seen today.  He does not have shortness of breath.  He is unsure of exposure that might of caused rash but does state that the day before rash.  He had been outside on the soccer field most of the day.  The history is provided by the patient.  Rash Associated symptoms: no fever   Eye Pain    History reviewed. No pertinent past medical history.  There are no problems to display for this patient.   History reviewed. No pertinent surgical history.     Home Medications    Prior to Admission medications   Medication Sig Start Date End Date Taking? Authorizing Provider  diclofenac (VOLTAREN) 75 MG EC tablet Take 1 tablet (75 mg total) by mouth 2 (two) times daily. 12/21/17   Wallene Huh, DPM  diclofenac (VOLTAREN) 75 MG EC tablet Take 1 tablet (75 mg total) by mouth 2 (two) times daily. 09/06/18   Wallene Huh, DPM    Family History History reviewed. No pertinent family history.  Social History Social History   Tobacco Use   Smoking status: Unknown   Smokeless tobacco: Never     Allergies   Patient has no known allergies.   Review of Systems Review of Systems  Constitutional:  Negative for chills and fever.  Eyes:  Negative for discharge and redness.  Skin:  Positive for color change and rash. Negative for wound.     Physical Exam Triage Vital Signs ED Triage Vitals  Enc Vitals Group     BP 02/02/22 0830 (!)  134/95     Pulse Rate 02/02/22 0830 65     Resp 02/02/22 0830 18     Temp 02/02/22 0830 97.6 F (36.4 C)     Temp src --      SpO2 02/02/22 0830 98 %     Weight --      Height --      Head Circumference --      Peak Flow --      Pain Score 02/02/22 0829 3     Pain Loc --      Pain Edu? --      Excl. in [place]? --    No data found.  Updated Vital Signs BP (!) 134/95   Pulse 65   Temp 97.6 F (36.4 C)   Resp 18   SpO2 98%      Physical Exam Vitals and nursing note reviewed.  Constitutional:      General: He is not in acute distress.    Appearance: Normal appearance. He is not ill-appearing.  HENT:     Head: Normocephalic and atraumatic.  Eyes:     Conjunctiva/sclera: Conjunctivae normal.  Cardiovascular:     Rate and Rhythm: Normal rate.  Pulmonary:     Effort:  Pulmonary effort is normal.  Skin:    Comments: Mild erythema and swelling noted to right upper and lower eyelid, cluster of mildly erythematous papular lesions to left inner forearm  Neurological:     Mental Status: He is alert.  Psychiatric:        Mood and Affect: Mood normal.        Behavior: Behavior normal.        Thought Content: Thought content normal.      UC Treatments / Results  Labs (all labs ordered are listed, but only abnormal results are displayed) Labs Reviewed - No data to display  EKG   Radiology No results found.  Procedures Procedures (including critical care time)  Medications Ordered in UC Medications  methylPREDNISolone sodium succinate (SOLU-MEDROL) 125 mg/2 mL injection 80 mg (80 mg Intramuscular Given 02/02/22 0853)    Initial Impression / Assessment and Plan / UC Course  I have reviewed the triage vital signs and the nursing notes.  Pertinent labs & imaging results that were available during my care of the patient were reviewed by me and considered in my medical decision making (see chart for details).    Unknown etiology of symptoms but does appear consistent  with some form of contact dermatitis- similar to poison ivy.  Will treat with steroid injection in office.  Encouraged follow-up if no gradual improvement or with any further concerns.  Final Clinical Impressions(s) / UC Diagnoses   Final diagnoses:  Rash and nonspecific skin eruption   Discharge Instructions   None    ED Prescriptions   None    PDMP not reviewed this encounter.   Tomi Bamberger, PA-C 02/02/22 0900

## 2023-11-10 ENCOUNTER — Ambulatory Visit
Admission: EM | Admit: 2023-11-10 | Discharge: 2023-11-10 | Disposition: A | Discharge status: Home / Self Care | Attending: Nurse Practitioner | Admitting: Nurse Practitioner

## 2023-11-10 DIAGNOSIS — L03012 Cellulitis of left finger: Secondary | ICD-10-CM | POA: Diagnosis present

## 2023-11-10 MED ORDER — MUPIROCIN 2 % EX OINT: TOPICAL_OINTMENT | CUTANEOUS | 0 refills | Status: AC

## 2023-11-10 MED ORDER — SULFAMETHOXAZOLE-TRIMETHOPRIM 800-160 MG PO TABS: 1.0000 | ORAL_TABLET | Freq: Two times a day (BID) | ORAL | 0 refills | Status: AC

## 2023-11-10 NOTE — ED Provider Notes (Signed)
 [facility]    CSN: [id number] Arrival date & time: 11/10/23  1804      History   Chief Complaint Chief Complaint  Patient presents with   finger infection    HPI [name] is a 48 y.o. male.   Discussed the use of AI scribe software for clinical note transcription with the patient, who gave verbal consent to proceed.   The patient presents with an infection in their finger. The infection is located to the distal portion of the left middle finger. The affected area is swollen and painful. The patient admits to biting their nails, which likely contributed to the development of the infection. The patient denies any history of diabetes. He tried to drain the area himself but only got blood return. No fevers.   The following portions of the patient's history were reviewed and updated as appropriate: allergies, current medications, past family history, past medical history, past social history, past surgical history, and problem list.    History reviewed. No pertinent past medical history.  There are no active problems to display for this patient.   History reviewed. No pertinent surgical history.     Home Medications    Prior to Admission medications   Medication Sig Start Date End Date Taking? Authorizing Provider  mupirocin  ointment (BACTROBAN ) 2 % Apply thin layer to affected nail area on the left middle finger twice a day after soaking in warm water then cover with dry dressing 11/10/23  Yes Iola Lukes, FNP  sulfamethoxazole -trimethoprim  (BACTRIM  DS) 800-160 MG tablet Take 1 tablet by mouth 2 (two) times daily for 7 days. 11/10/23 11/17/23 Yes Iola Lukes, FNP  diclofenac  (VOLTAREN ) 75 MG EC tablet Take 1 tablet (75 mg total) by mouth 2 (two) times daily. 12/21/17   Magdalen Pasco RAMAN, DPM  diclofenac  (VOLTAREN ) 75 MG EC tablet Take 1 tablet (75 mg total) by mouth 2 (two) times daily. 09/06/18   Magdalen Pasco RAMAN, DPM    Family History History  reviewed. No pertinent family history.  Social History Social History   Tobacco Use   Smoking status: Unknown   Smokeless tobacco: Never     Allergies   Patient has no known allergies.   Review of Systems Review of Systems  Constitutional:  Negative for fever.  Skin:  Positive for wound.  All other systems reviewed and are negative.    Physical Exam Triage Vital Signs ED Triage Vitals  Encounter Vitals Group     BP 11/10/23 1827 133/87     Girls Systolic BP Percentile --      Girls Diastolic BP Percentile --      Boys Systolic BP Percentile --      Boys Diastolic BP Percentile --      Pulse Rate 11/10/23 1827 80     Resp 11/10/23 1827 17     Temp 11/10/23 1827 98.7 F (37.1 C)     Temp Source 11/10/23 1827 Oral     SpO2 11/10/23 1827 98 %     Weight --      Height --      Head Circumference --      Peak Flow --      Pain Score 11/10/23 1826 4     Pain Loc --      Pain Education --      Exclude from Growth Chart --    No data found.  Updated Vital Signs BP 133/87 (BP Location: Right Arm)   Pulse  80   Temp 98.7 F (37.1 C) (Oral)   Resp 17   SpO2 98%   Visual Acuity Right Eye Distance:   Left Eye Distance:   Bilateral Distance:    Right Eye Near:   Left Eye Near:    Bilateral Near:     Physical Exam Vitals reviewed.  Constitutional:      General: He is awake. He is not in acute distress.    Appearance: Normal appearance. He is well-developed. He is not ill-appearing, toxic-appearing or diaphoretic.  HENT:     Head: Normocephalic.     Right Ear: Hearing normal.     Left Ear: Hearing normal.     Nose: Nose normal.     Mouth/Throat:     Mouth: Mucous membranes are moist.  Eyes:     General: Vision grossly intact.     Conjunctiva/sclera: Conjunctivae normal.  Cardiovascular:     Rate and Rhythm: Normal rate and regular rhythm.     Heart sounds: Normal heart sounds.  Pulmonary:     Effort: Pulmonary effort is normal.     Breath sounds:  Normal breath sounds and air entry.  Musculoskeletal:        General: Normal range of motion.       Hands:     Cervical back: Full passive range of motion without pain, normal range of motion and neck supple.     Comments: Left middle finger reveals localized swelling, erythema, and tenderness along the lateral nail fold. An area of fluctuance is noted. The surrounding skin is warm to the touch, and the nail plate appears intact. Finger has full strength, sensation and ROM. Neurovascularly intact.   Skin:    General: Skin is warm and dry.  Neurological:     General: No focal deficit present.     Mental Status: He is alert and oriented to person, place, and time.  Psychiatric:        Speech: Speech normal.        Behavior: Behavior is cooperative.      UC Treatments / Results  Labs (all labs ordered are listed, but only abnormal results are displayed) Labs Reviewed  AEROBIC CULTURE W GRAM STAIN (SUPERFICIAL SPECIMEN)    EKG   Radiology No results found.  Procedures Incision and Drainage  Date/Time: 11/10/2023 8:00 PM  Performed by: Iola Lukes, FNP Authorized by: Iola Lukes, FNP   Consent:    Consent obtained:  Verbal   Consent given by:  Patient   Risks, benefits, and alternatives were discussed: yes     Risks discussed:  Bleeding, incomplete drainage, pain and infection Universal protocol:    Patient identity confirmed:  Verbally with patient and arm band Location:    Type:  Abscess (paronychia)   Location:  Upper extremity   Upper extremity location:  Finger   Finger location:  L long finger Pre-procedure details:    Skin preparation:  Povidone-iodine Anesthesia:    Anesthesia method:  None Procedure type:    Complexity:  Simple Procedure details:    Incision types:  Single with marsupialization   Drainage:  Purulent   Drainage amount:  Moderate   Wound treatment:  Wound left open (nonadherent dressing applied and wrapped in  coban) Post-procedure details:    Procedure completion:  Tolerated well, no immediate complications  (including critical care time)  Medications Ordered in UC Medications - No data to display  Initial Impression / Assessment and Plan / UC Course  I  have reviewed the triage vital signs and the nursing notes.  Pertinent labs & imaging results that were available during my care of the patient were reviewed by me and considered in my medical decision making (see chart for details).     Patient presents with paronychia of the left middle finger accompanied by localized abscess and signs of infection, likely secondary to nail-biting behavior. Clinical exam confirms presence of fluctuance and surrounding erythema consistent with localized soft tissue infection. An incision and drainage procedure was performed to evacuate the abscess. The wound was cleaned and a culture was obtained for further evaluation. The patient was prescribed topical mupirocin  to be applied twice daily after warm water soaks, along with oral Bactrim  DS twice daily for 7 days to treat the infection systemically. Wound culture results are pending and treatment will be adjusted if necessary based on sensitivity. Patient was advised to monitor for worsening redness, swelling, pus, fever, or progression of symptoms and to follow up if no improvement within a few days. Return precautions discussed.  Today's evaluation has revealed no signs of a dangerous process. Discussed diagnosis with patient and/or guardian. Patient and/or guardian aware of their diagnosis, possible red flag symptoms to watch out for and need for close follow up. Patient and/or guardian understands verbal and written discharge instructions. Patient and/or guardian comfortable with plan and disposition.  Patient and/or guardian has a clear mental status at this time, good insight into illness (after discussion and teaching) and has clear judgment to make decisions  regarding their care  Documentation was completed with the aid of voice recognition software. Transcription may contain typographical errors.  Final Clinical Impressions(s) / UC Diagnoses   Final diagnoses:  Paronychia of left middle finger     Discharge Instructions      You were treated today for an infected area around your left middle fingernail, known as paronychia, which developed into a small abscess likely caused by nail-biting. An incision and drainage procedure was performed to remove the buildup of pus, and a wound culture was sent to help identify the specific bacteria involved.  To care for the area at home, soak your finger in warm water for 10 to 15 minutes, two to three times a day to promote healing. After soaking, gently pat the area dry and apply a thin layer of mupirocin  ointment twice daily as prescribed. You were also prescribed Bactrim  DS to take by mouth twice daily for 7 days; be sure to take the full course, even if symptoms improve before it is finished.  Keep the finger clean and avoid picking or biting at the area.Avoid activities that may re-injure the site until it has fully healed.  Contact your primary care provider or return here if the redness spreads, swelling worsens, or you notice increased drainage or pain.   Seek immediate medical care if you develop fever, chills, red streaking up the hand or arm, or difficulty moving the finger, as these could be signs of a more serious infection.      ED Prescriptions     Medication Sig Dispense Auth. Provider   mupirocin  ointment (BACTROBAN ) 2 % Apply thin layer to affected nail area on the left middle finger twice a day after soaking in warm water then cover with dry dressing 15 g Iola Lukes, FNP   sulfamethoxazole -trimethoprim  (BACTRIM  DS) 800-160 MG tablet Take 1 tablet by mouth 2 (two) times daily for 7 days. 14 tablet Iola Lukes, FNP      PDMP  not reviewed this encounter.   Iola Lukes, [place] 11/10/23 2040

## 2023-11-10 NOTE — Discharge Instructions (Addendum)
 You were treated today for an infected area around your left middle fingernail, known as paronychia, which developed into a small abscess likely caused by nail-biting. An incision and drainage procedure was performed to remove the buildup of pus, and a wound culture was sent to help identify the specific bacteria involved.  To care for the area at home, soak your finger in warm water for 10 to 15 minutes, two to three times a day to promote healing. After soaking, gently pat the area dry and apply a thin layer of mupirocin  ointment twice daily as prescribed. You were also prescribed Bactrim  DS to take by mouth twice daily for 7 days; be sure to take the full course, even if symptoms improve before it is finished.  Keep the finger clean and avoid picking or biting at the area.Avoid activities that may re-injure the site until it has fully healed.  Contact your primary care provider or return here if the redness spreads, swelling worsens, or you notice increased drainage or pain.   Seek immediate medical care if you develop fever, chills, red streaking up the hand or arm, or difficulty moving the finger, as these could be signs of a more serious infection.

## 2023-11-10 NOTE — ED Triage Notes (Signed)
 Pt present with redness and swelling to the lt middle finger x four days. Pt states it is painful and warm to the touch.

## 2023-11-15 ENCOUNTER — Ambulatory Visit (HOSPITAL_COMMUNITY): Payer: Self-pay

## 2023-11-15 LAB — AEROBIC CULTURE W GRAM STAIN (SUPERFICIAL SPECIMEN)
Culture: NO GROWTH
Special Requests: NORMAL

## 2023-11-23 ENCOUNTER — Ambulatory Visit: Payer: Self-pay
